# Patient Record
Sex: Female | Born: 2000 | Race: Black or African American | Hispanic: No | Marital: Single | Smoking: Never smoker
Health system: Southern US, Community
[De-identification: ages and names within clinical notes are randomized; demographics above are authoritative.]

---
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
## 2022-02-09 DIAGNOSIS — R8289 Other abnormal findings on cytological and histological examination of urine: Secondary | ICD-10-CM | POA: Insufficient documentation

## 2022-02-09 DIAGNOSIS — R509 Fever, unspecified: Secondary | ICD-10-CM | POA: Diagnosis not present

## 2022-02-09 DIAGNOSIS — R112 Nausea with vomiting, unspecified: Secondary | ICD-10-CM | POA: Diagnosis present

## 2022-02-10 ENCOUNTER — Encounter: Payer: Self-pay | Admitting: Emergency Medicine

## 2022-02-10 ENCOUNTER — Emergency Department
Admission: EM | Admit: 2022-02-10 | Discharge: 2022-02-10 | Disposition: A | Discharge status: Home / Self Care | Payer: Medicaid Other | Attending: Emergency Medicine | Admitting: Emergency Medicine

## 2022-02-10 DIAGNOSIS — R112 Nausea with vomiting, unspecified: Secondary | ICD-10-CM

## 2022-02-10 LAB — URINALYSIS, ROUTINE W REFLEX MICROSCOPIC
Bilirubin Urine: NEGATIVE
Glucose, UA: NEGATIVE mg/dL
Ketones, ur: NEGATIVE mg/dL
Leukocytes,Ua: NEGATIVE
Nitrite: NEGATIVE
Protein, ur: 30 mg/dL — AB
RBC / HPF: >50 RBC/hpf — ABNORMAL HIGH (ref 0–5)
Specific Gravity, Urine: 1.021 (ref 1.005–1.030)
pH: 7 (ref 5.0–8.0)

## 2022-02-10 LAB — CBC WITH DIFFERENTIAL/PLATELET
Abs Immature Granulocytes: 0.01 10*3/uL (ref 0.00–0.07)
Basophils Absolute: 0.1 10*3/uL (ref 0.0–0.1)
Basophils Relative: 1 %
Eosinophils Absolute: 0.1 10*3/uL (ref 0.0–0.5)
Eosinophils Relative: 2 %
HCT: 39.8 % (ref 36.0–46.0)
Hemoglobin: 13.2 g/dL (ref 12.0–15.0)
Immature Granulocytes: 0 %
Lymphocytes Relative: 30 %
Lymphs Abs: 2.6 10*3/uL (ref 0.7–4.0)
MCH: 27.7 pg (ref 26.0–34.0)
MCHC: 33.2 g/dL (ref 30.0–36.0)
MCV: 83.6 fL (ref 80.0–100.0)
Monocytes Absolute: 0.5 10*3/uL (ref 0.1–1.0)
Monocytes Relative: 6 %
Neutro Abs: 5.4 10*3/uL (ref 1.7–7.7)
Neutrophils Relative %: 61 %
Platelets: 273 10*3/uL (ref 150–400)
RBC: 4.76 MIL/uL (ref 3.87–5.11)
RDW: 13.1 % (ref 11.5–15.5)
WBC: 8.7 10*3/uL (ref 4.0–10.5)
nRBC: 0 % (ref 0.0–0.2)

## 2022-02-10 LAB — COMPREHENSIVE METABOLIC PANEL
ALT: 9 U/L (ref 0–44)
AST: 16 U/L (ref 15–41)
Albumin: 4 g/dL (ref 3.5–5.0)
Alkaline Phosphatase: 63 U/L (ref 38–126)
Anion gap: 7 (ref 5–15)
BUN: 10 mg/dL (ref 6–20)
CO2: 19 mmol/L — ABNORMAL LOW (ref 22–32)
Calcium: 8.8 mg/dL — ABNORMAL LOW (ref 8.9–10.3)
Chloride: 113 mmol/L — ABNORMAL HIGH (ref 98–111)
Creatinine, Ser: 1.01 mg/dL — ABNORMAL HIGH (ref 0.44–1.00)
GFR, Estimated: >60 mL/min (ref >60)
Glucose, Bld: 93 mg/dL (ref 70–99)
Potassium: 3.8 mmol/L (ref 3.5–5.1)
Sodium: 139 mmol/L (ref 135–145)
Total Bilirubin: 0.7 mg/dL (ref 0.3–1.2)
Total Protein: 6.7 g/dL (ref 6.5–8.1)

## 2022-02-10 LAB — POC URINE PREG, ED: Preg Test, Ur: NEGATIVE

## 2022-02-10 LAB — LIPASE, BLOOD: Lipase: 28 U/L (ref 11–51)

## 2022-02-10 MED ORDER — SODIUM CHLORIDE 0.9 % IV BOLUS (SEPSIS)
1000.0000 mL | Freq: Once | INTRAVENOUS | Status: AC
  Administered 2022-02-10: 1000 mL via INTRAVENOUS

## 2022-02-10 MED ORDER — ONDANSETRON 4 MG PO TBDP: 4.0000 mg | ORAL_TABLET | Freq: Four times a day (QID) | ORAL | 0 refills | Status: DC | PRN

## 2022-02-10 MED ORDER — ONDANSETRON 4 MG PO TBDP
4.0000 mg | ORAL_TABLET | Freq: Once | ORAL | Status: AC
  Administered 2022-02-10: 4 mg via ORAL
  Filled 2022-02-10: qty 1

## 2022-02-10 MED ORDER — KETOROLAC TROMETHAMINE 30 MG/ML IJ SOLN
30.0000 mg | Freq: Once | INTRAMUSCULAR | Status: AC
  Administered 2022-02-10: 30 mg via INTRAVENOUS
  Filled 2022-02-10: qty 1

## 2022-02-10 MED ORDER — ONDANSETRON HCL 4 MG/2ML IJ SOLN
4.0000 mg | Freq: Once | INTRAMUSCULAR | Status: AC
  Administered 2022-02-10: 4 mg via INTRAVENOUS
  Filled 2022-02-10: qty 2

## 2022-02-10 NOTE — ED Triage Notes (Signed)
Pt here from home with c/o n/v and feels like she has been running fevers , started 2 days ago  ?

## 2022-02-10 NOTE — ED Provider Triage Note (Signed)
Emergency Medicine Provider Triage Evaluation Note ? ?Kathy Beard , a 21 y.o. female  was evaluated in triage.  Pt complains of N/V/D x 2-3 days.  No abdominal pain.  No vaginal symptoms.  Subjective fever but no measurable fever. ? ?Admits to occasional alcohol consumption and frequent marijuana use. ? ?Review of Systems  ?Positive: N/V/D, subjective fever ?Negative: Abd pain, chest pain, SOB. ? ?Physical Exam  ?BP 137/82 (BP Location: Right Arm)   Pulse 70   Temp 98.9 ?F (37.2 ?C) (Oral)   Resp 18   Ht 1.676 m (5\' 6" )   Wt 79.4 kg   LMP 02/10/2022   SpO2 100%   BMI 28.25 kg/m?  ?Gen:   Awake, no obvious distress. ?Resp:  Normal effort  ?MSK:   Moves extremities without difficulty  ?Abdomen: No tenderness to palpation of the abdomen.  No distention. ? ?Medical Decision Making  ?Medically screening exam initiated at 12:10 AM.  Appropriate orders placed.  02/12/2022 T Shelton was informed that the remainder of the evaluation will be completed by another provider, this initial triage assessment does not replace that evaluation, and the importance of remaining in the ED until their evaluation is complete. ? ?Vital signs stable.  No obvious immediate life threat.  Ordered Zofran 4 mg ODT.  Ordered CMP, CBC with differential, lipase, urinalysis, and point-of-care urine pregnancy test.  Patient admits to marijuana use, no indication for urine drug screen at this time. ?  ?[place], MD ?02/10/22 0012 ? ?

## 2022-02-10 NOTE — ED Notes (Signed)
Several attempts have been made by this tech and tech Latricia to draw labs to no success.   ?

## 2022-02-10 NOTE — ED Notes (Addendum)
Multiple attempts made to obtain labs by multiple staff members. Lab called to attempt collection ?

## 2022-02-10 NOTE — Discharge Instructions (Addendum)
You may alternate Tylenol 1000 mg every 6 hours as needed for pain, fever and Ibuprofen 800 mg every 6-8 hours as needed for pain, fever.  Please take Ibuprofen with food.  Do not take more than 4000 mg of Tylenol (acetaminophen) in a 24 hour period.  You may use over-the-counter Imodium as needed for diarrhea.   Steps to find a Primary Care Provider (PCP):  Call [phone] or [phone] to access "[facility] Find a Doctor Service."  2.  You may also go on the [facility] website at [email or web address]  

## 2022-02-10 NOTE — ED Provider Notes (Signed)
? ?[facility] ?Provider Note ? ? ? Event Date/Time  ? First MD Initiated Contact with Patient 02/10/22 0448   ?  (approximate) ? ? ?History  ? ?Nausea ? ? ?HPI ? ?[place] T Doucet is a 21 y.o. female with no significant past medical history who presents to the emergency department with nausea and vomiting that started today.  Has had subjective fevers.  No diarrhea.  No dysuria or hematuria.  No vaginal discharge.  Currently on her menstrual cycle.  No previous abdominal surgeries.  No sick contacts or recent travel. ? ? ?History provided by patient and mother. ? ? ? ?History reviewed. No pertinent past medical history. ? ?Past Surgical History:  ?Procedure Laterality Date  ? PILONIDAL CYST EXCISION N/A 02/03/2019  ? Procedure: CYST EXCISION PILONIDAL SIMPLE;  Surgeon: Duanne Guess, MD;  Location: [facility] ORS;  Service: General;  Laterality: N/A;  ? ? ?MEDICATIONS:  ?Prior to Admission medications   ?Medication Sig Start Date End Date Taking? Authorizing Provider  ?cephALEXin (KEFLEX) 500 MG capsule Take 2 capsules (1,000 mg total) by mouth 2 (two) times daily. 01/26/19   Cuthriell, Delorise Royals, PA-C  ?HYDROcodone-acetaminophen (NORCO/VICODIN) 5-325 MG tablet Take 1 tablet by mouth every 4 (four) hours as needed for moderate pain. ?Patient not taking: Reported on 02/26/2021 01/26/19   Cuthriell, Delorise Royals, PA-C  ?HYDROcodone-acetaminophen (NORCO/VICODIN) 5-325 MG tablet Take 1 tablet by mouth every 6 (six) hours as needed for moderate pain (and prior to dressing changes). ?Patient not taking: Reported on 02/26/2021 02/03/19   Duanne Guess, MD  ?ibuprofen (ADVIL) 200 MG tablet Take 400 mg by mouth every 6 (six) hours as needed for moderate pain.    [provider]  ? ? ?Physical Exam  ? ?Triage Vital Signs: ?ED Triage Vitals  ?Enc Vitals Group  ?   BP 02/09/22 2345 137/82  ?   Pulse Rate 02/09/22 2345 70  ?   Resp 02/09/22 2345 18  ?   Temp 02/09/22 2345 98.9 ?F (37.2 ?C)  ?   Temp  Source 02/09/22 2345 Oral  ?   SpO2 02/09/22 2345 100 %  ?   Weight 02/10/22 0006 175 lb (79.4 kg)  ?   Height 02/10/22 0006 5\' 6"  (1.676 m)  ?   Head Circumference --   ?   Peak Flow --   ?   Pain Score 02/10/22 0006 5  ?   Pain Loc --   ?   Pain Edu? --   ?   Excl. in GC? --   ? ? ?Most recent vital signs: ?Vitals:  ? 02/10/22 0346 02/10/22 0618  ?BP: (!) 143/79 128/72  ?Pulse: 79 67  ?Resp: 18 20  ?Temp: 98.5 ?F (36.9 ?C)   ?SpO2: 99% 100%  ? ? ?CONSTITUTIONAL: Alert and oriented and responds appropriately to questions. Well-appearing; well-nourished ?HEAD: Normocephalic, atraumatic ?EYES: Conjunctivae clear, pupils appear equal, sclera nonicteric ?ENT: normal nose; moist mucous membranes ?NECK: Supple, normal ROM ?CARD: RRR; S1 and S2 appreciated; no murmurs, no clicks, no rubs, no gallops ?RESP: Normal chest excursion without splinting or tachypnea; breath sounds clear and equal bilaterally; no wheezes, no rhonchi, no rales, no hypoxia or respiratory distress, speaking full sentences ?ABD/GI: Normal bowel sounds; non-distended; soft, non-tender, no rebound, no guarding, no peritoneal signs, no tenderness at McBurney's point, negative Murphy sign ?BACK: The back appears normal ?EXT: Normal ROM in all joints; no deformity noted, no edema; no cyanosis ?SKIN: Normal color for age and  race; warm; no rash on exposed skin ?NEURO: Moves all extremities equally, normal speech ?PSYCH: The patient's mood and manner are appropriate. ? ? ?ED Results / Procedures / Treatments  ? ?LABS: ?(all labs ordered are listed, but only abnormal results are displayed) ?Labs Reviewed  ?URINALYSIS, ROUTINE W REFLEX MICROSCOPIC - Abnormal; Notable for the following components:  ?    Result Value  ? Color, Urine YELLOW (*)   ? APPearance TURBID (*)   ? Hgb urine dipstick LARGE (*)   ? Protein, ur 30 (*)   ? RBC / HPF >50 (*)   ? Bacteria, UA MANY (*)   ? All other components within normal limits  ?COMPREHENSIVE METABOLIC PANEL - Abnormal;  Notable for the following components:  ? Chloride 113 (*)   ? CO2 19 (*)   ? Creatinine, Ser 1.01 (*)   ? Calcium 8.8 (*)   ? All other components within normal limits  ?LIPASE, BLOOD  ?CBC WITH DIFFERENTIAL/PLATELET  ?CBC WITH DIFFERENTIAL/PLATELET  ?POC URINE PREG, ED  ? ? ? ?EKG: ? ? ?RADIOLOGY: ?My personal review and interpretation of imaging:   ? ?I have personally reviewed all radiology reports.   ?No results found. ? ? ?PROCEDURES: ? ?Critical Care performed: No ? ? ? ? ?Procedures ? ? ? ?IMPRESSION / MDM / ASSESSMENT AND PLAN / ED COURSE  ?I reviewed the triage vital signs and the nursing notes. ? ? ? ?Patient here with nausea, vomiting.  Benign abdominal exam.  Afebrile here. ? ? ? ?DIFFERENTIAL DIAGNOSIS (includes but not limited to):   Viral gastroenteritis, gastritis, GERD, doubt cholecystitis, pancreatitis, appendicitis, colitis, diverticulitis, bowel obstruction, perforation based on benign exam ? ? ?PLAN: We will obtain CBC, CMP, lipase, urinalysis, urine pregnancy.  Given oral Zofran in triage and states she vomited immediately afterward.  Will place IV and give IV fluids, IV Zofran and Toradol for symptomatic relief.  I do not feel at this time she needs emergent abdominal imaging given benign abdominal exam. ? ? ?MEDICATIONS GIVEN IN ED: ?Medications  ?ondansetron (ZOFRAN-ODT) disintegrating tablet 4 mg (4 mg Oral Given 02/10/22 0029)  ?sodium chloride 0.9 % bolus 1,000 mL (0 mLs Intravenous Stopped 02/10/22 0538)  ?ondansetron [facility](ZOFRAN) injection 4 mg (4 mg Intravenous Given 02/10/22 0508)  ?ketorolac (TORADOL) 30 MG/ML injection 30 mg (30 mg Intravenous Given 02/10/22 0510)  ? ? ? ?ED COURSE: Patient's labs show no leukocytosis.  Normal hemoglobin.  Normal electrolytes, LFTs and lipase.  Pregnancy test negative.  Urine does show a large amount of blood and white blood cells with many bacteria.  She is not having urinary symptoms.  I think this is from her menstrual cycle and she agrees.  She feels  much better after IV medications and is able to tolerate p.o.  I feel she is safe for discharge home.  Discussed supportive care instructions and return precautions.  Will discharge with Zofran. ? ? ?At this time, I do not feel there is any life-threatening condition present. I reviewed all nursing notes, vitals, pertinent previous records.  All lab and urine results, EKGs, imaging ordered have been independently reviewed and interpreted by myself.  I reviewed all available radiology reports from any imaging ordered this visit.  Based on my assessment, I feel the patient is safe to be discharged home without further emergent workup and can continue workup as an outpatient as needed. Discussed all findings, treatment plan as well as usual and customary return precautions with patient and mother.  They verbalize understanding and are comfortable with this plan.  Outpatient follow-up has been provided as needed.  All questions have been answered. ? ? ?CONSULTS: No admission needed at this time given reassuring work-up, benign abdominal exam patient feeling better tolerating p.o. ? ? ?OUTSIDE RECORDS REVIEWED: Reviewed patient's previous surgical note with Duanne Guess for pilonidal cyst on 02/03/2019. ? ? ? ? ? ? ? ? ?FINAL CLINICAL IMPRESSION(S) / ED DIAGNOSES  ? ?Final diagnoses:  ?Nausea and vomiting in adult  ? ? ? ?Rx / DC Orders  ? ?ED Discharge Orders   ? ?      Ordered  ?  ondansetron (ZOFRAN-ODT) 4 MG disintegrating tablet  Every 6 hours PRN       ? 02/10/22 0559  ? ?  ?  ? ?  ? ? ? ?Note:  This document was prepared using Dragon voice recognition software and may include unintentional dictation errors. ?  ?Bellamia Ferch, Layla Maw, DO ?02/10/22 8341 ? ?

## 2023-07-17 ENCOUNTER — Other Ambulatory Visit: Payer: Self-pay

## 2023-07-17 ENCOUNTER — Ambulatory Visit (LOCAL_COMMUNITY_HEALTH_CENTER): Payer: Self-pay

## 2023-07-17 DIAGNOSIS — Z111 Encounter for screening for respiratory tuberculosis: Secondary | ICD-10-CM

## 2023-07-31 ENCOUNTER — Ambulatory Visit (LOCAL_COMMUNITY_HEALTH_CENTER): Payer: Self-pay

## 2023-07-31 DIAGNOSIS — Z111 Encounter for screening for respiratory tuberculosis: Secondary | ICD-10-CM

## 2023-08-03 ENCOUNTER — Ambulatory Visit: Payer: Medicaid Other

## 2023-08-03 DIAGNOSIS — Z111 Encounter for screening for respiratory tuberculosis: Secondary | ICD-10-CM

## 2023-08-03 LAB — TB SKIN TEST
Induration: 0 mm
TB Skin Test: NEGATIVE

## 2024-02-22 ENCOUNTER — Other Ambulatory Visit: Payer: Self-pay

## 2024-02-22 ENCOUNTER — Emergency Department
Admission: EM | Admit: 2024-02-22 | Discharge: 2024-02-23 | Disposition: A | Discharge status: Home / Self Care | Attending: Emergency Medicine | Admitting: Emergency Medicine

## 2024-02-22 DIAGNOSIS — L0231 Cutaneous abscess of buttock: Secondary | ICD-10-CM | POA: Insufficient documentation

## 2024-02-22 MED ORDER — LIDOCAINE HCL (PF) 1 % IJ SOLN
10.0000 mL | Freq: Once | INTRAMUSCULAR | Status: AC
  Administered 2024-02-22: 10 mL
  Filled 2024-02-22: qty 10

## 2024-02-22 NOTE — ED Notes (Signed)
 This RN and provider present for assessment of abscess.

## 2024-02-22 NOTE — ED Provider Notes (Signed)
 [facility] Provider Note    Event Date/Time   First MD Initiated Contact with Patient 02/22/24 2257     (approximate)   History   Cyst   HPI Kathy Beard is a 23 y.o. female  who presents for evaluation of a boil or abscess to the inner left buttock near her labia.  It has been present for 2-3 days and it getting bigger, now firm and extending more over to her labia.  Her underwear rubs on the area and it hurts worse when she walks or sits on the area.  Went to urgent care two days ago and they prescribed antibiotics, but said it was not ready to be drained.  It has gotten bigger, though, and she said she feels like the pressure needs to be relieved.  No fever, no other lesions.  History of prior pilonidal cyst I&D in the OR.       Physical Exam   Triage Vital Signs: ED Triage Vitals  Encounter Vitals Group     BP 02/22/24 2238 138/77     Systolic BP Percentile --      Diastolic BP Percentile --      Pulse Rate 02/22/24 2238 91     Resp 02/22/24 2238 18     Temp 02/22/24 2238 98.6 F (37 C)     Temp Source 02/22/24 2238 Oral     SpO2 02/22/24 2238 100 %     Weight 02/22/24 2237 82.6 kg (182 lb)     Height 02/22/24 2237 1.676 m (5\' 6" )     Head Circumference --      Peak Flow --      Pain Score 02/22/24 2237 9     Pain Loc --      Pain Education --      Exclude from Growth Chart --     Most recent vital signs: Vitals:   02/22/24 2238  BP: 138/77  Pulse: 91  Resp: 18  Temp: 98.6 F (37 C)  SpO2: 100%    General: Awake, no distress but appears uncomfortable when she moves around. CV:  Good peripheral perfusion.  Resp:  Normal effort. Speakin g easily and comfortably, no accessory muscle usage nor intercostal retractions.   Abd:  No distention.  Other:  Approx 4-cm long and 2-cm wide area of fluctuence and tenderness with surrounding induration of the medial buttock approaching the left labia majora.  Some induration extends to  the labia majora, but this is primarily a cutaneous abscess of the buttocks, not in the area of the Bartholin gland or other genital structures.   ED Results / Procedures / Treatments   Labs (all labs ordered are listed, but only abnormal results are displayed) Labs Reviewed  AEROBIC/ANAEROBIC CULTURE W GRAM STAIN (SURGICAL/DEEP WOUND)      PROCEDURES:  Critical Care performed: No  .Ultrasound ED Soft Tissue  Date/Time: 02/23/2024 12:26 AM  Performed by: Lynnda Sas, MD Authorized by: Lynnda Sas, MD   Procedure details:    Indications: localization of abscess     Transverse view:  Visualized   Longitudinal view:  Visualized   Images: not archived   Location:    Location: buttocks     Side:  Left Findings:     abscess present    no cellulitis present    no foreign body present .Incision and Drainage  Date/Time: 02/23/2024 12:28 AM  Performed by: Lynnda Sas, MD Authorized by: Lynnda Sas, MD  Consent:    Consent obtained:  Verbal   Consent given by:  Patient   Risks discussed:  Bleeding, infection, incomplete drainage and pain   Alternatives discussed:  Alternative treatment, delayed treatment and observation Universal protocol:    Patient identity confirmed:  Verbally with patient and arm band Location:    Type:  Abscess   Size:  4x2cm   Location:  Anogenital   Anogenital location: buttock near left labia majora. Pre-procedure details:    Skin preparation:  Povidone-iodine Sedation:    Sedation type:  None Anesthesia:    Anesthesia method:  Local infiltration   Local anesthetic:  Lidocaine  1% w/o epi Procedure type:    Complexity:  Complex Procedure details:    Ultrasound guidance: yes     Needle aspiration: no     Incision types:  Single straight   Wound management:  Probed and deloculated   Drainage:  Bloody and purulent   Drainage amount:  Copious   Wound treatment:  Wound left open   Packing materials:  None Post-procedure details:     Procedure completion:  Tolerated well, no immediate complications     IMPRESSION / MDM / ASSESSMENT AND PLAN / ED COURSE  I reviewed the triage vital signs and the nursing notes.                              Differential diagnosis includes, but is not limited to, abscess, phlegmon, cellulitis.  Patient's presentation is most consistent with acute, uncomplicated illness.  Labs/studies ordered: Wound culture  Interventions/Medications given:  Medications  lidocaine  (PF) (XYLOCAINE ) 1 % injection 10 mL (10 mLs Other Given 02/22/24 2354)    (Note:  hospital course my include additional interventions and/or labs/studies not listed above.)   Abscess identified by bedside ultrasound and consistent with clinical presentation.  Discussed risks and benefits with patient, performed I&D with successful drainage of mostly blood but some purulent material as well.  Induration remains but fluctuance is gone.  No indication for packing.  Gave usual and customary management recommendations and return precautions and provided general surgery clinic contact information for follow-up.  Patient tolerated the procedure well and is pleased with the result and said it already feels better.  Wound culture pending.         FINAL CLINICAL IMPRESSION(S) / ED DIAGNOSES   Final diagnoses:  Cutaneous abscess of buttock     Rx / DC Orders   ED Discharge Orders     None        Note:  This document was prepared using Dragon voice recognition software and may include unintentional dictation errors.   Lynnda Sas, MD 02/23/24 [phone]

## 2024-02-22 NOTE — ED Triage Notes (Addendum)
 Pt reports cyst on her left labia for the past 2 days, pt reports she was seen at [facility] and given medication she cannot recall name of. Pt states area has gotten bigger since.

## 2024-02-23 NOTE — Discharge Instructions (Addendum)
 You have been seen in the Emergency Department (ED) today for an abscess.  This was drained in the ED.  Please follow up with your doctor or in 48 hours or so for recheck of your wound (if possible).  Read through the additional discharge instructions included below regarding wound care recommendations.  Keep the wound clean and dry, though you may wash as you would normally, and it would actually be a good idea to take one or two Sitz baths a day to keep the wound open and draining and clean.  Please read through the included information about this process.  Complete the full course of antibiotics that you were prescribed previously.  Call your doctor sooner or return to the ED if you develop worsening signs of infection such as: increased redness, increased pain, pus, or fever.

## 2024-02-28 LAB — AEROBIC/ANAEROBIC CULTURE W GRAM STAIN (SURGICAL/DEEP WOUND)

## 2024-02-29 ENCOUNTER — Other Ambulatory Visit (HOSPITAL_COMMUNITY): Payer: Self-pay

## 2024-02-29 ENCOUNTER — Telehealth (HOSPITAL_COMMUNITY): Payer: Self-pay | Admitting: Pharmacy Technician

## 2024-02-29 NOTE — Consult Note (Signed)
 Spoke to patient and she stated her boil is closing up and improving. No changes to treatment plan. Case discussed with Dr. Tin Tang. Culture was growing corynebacterium (which is normal skin flora) and pt was prescribed bactrim .    Harlie Lieu, PharmD, BCPS.

## 2024-02-29 NOTE — Telephone Encounter (Signed)
 Patient Product/process development scientist completed.    The patient is insured through [employer]  [place].     Ran test claim for linezolid 600 mg and the current 10 day co-pay is $4.00.   This test claim was processed through  [facility]- copay amounts may vary at other pharmacies due to pharmacy/plan contracts, or as the patient moves through the different stages of their insurance plan.     Morgan Arab, CPHT Pharmacy Technician III Certified Patient Advocate [facility] Patient Advocate Team Direct Number: [phone]  Fax: [phone]

## 2024-03-28 ENCOUNTER — Encounter: Payer: Self-pay | Admitting: Intensive Care

## 2024-03-28 ENCOUNTER — Emergency Department
Admission: EM | Admit: 2024-03-28 | Discharge: 2024-03-28 | Discharge status: Against Medical Advice | Attending: Emergency Medicine | Admitting: Emergency Medicine

## 2024-03-28 ENCOUNTER — Other Ambulatory Visit: Payer: Self-pay

## 2024-03-28 DIAGNOSIS — Z5321 Procedure and treatment not carried out due to patient leaving prior to being seen by health care provider: Secondary | ICD-10-CM | POA: Diagnosis not present

## 2024-03-28 DIAGNOSIS — N764 Abscess of vulva: Secondary | ICD-10-CM | POA: Insufficient documentation

## 2024-03-28 NOTE — ED Notes (Signed)
 Patient to First Nurse Desk stating she no longer wanted to be seen. PT informed that we would be taking her to a room shortly but patient declined. NAD noted. Pt left ED with steady gait.

## 2024-03-28 NOTE — ED Triage Notes (Signed)
 Patient presents with left groin/labia abscess.   History same
